# Patient Record
Sex: Male | Born: 1960 | Race: White | Hispanic: No | Marital: Married | Smoking: Never smoker
Health system: Southern US, Community
[De-identification: ages and names within clinical notes are randomized; demographics above are authoritative.]

## PROBLEM LIST (undated history)

## (undated) DIAGNOSIS — E7800 Pure hypercholesterolemia, unspecified: Secondary | ICD-10-CM

## (undated) DIAGNOSIS — G629 Polyneuropathy, unspecified: Secondary | ICD-10-CM

## (undated) DIAGNOSIS — Z8719 Personal history of other diseases of the digestive system: Secondary | ICD-10-CM

## (undated) DIAGNOSIS — N189 Chronic kidney disease, unspecified: Secondary | ICD-10-CM

## (undated) DIAGNOSIS — K219 Gastro-esophageal reflux disease without esophagitis: Secondary | ICD-10-CM

## (undated) DIAGNOSIS — G4730 Sleep apnea, unspecified: Secondary | ICD-10-CM

## (undated) DIAGNOSIS — E119 Type 2 diabetes mellitus without complications: Secondary | ICD-10-CM

## (undated) DIAGNOSIS — D649 Anemia, unspecified: Secondary | ICD-10-CM

## (undated) DIAGNOSIS — I10 Essential (primary) hypertension: Secondary | ICD-10-CM

## (undated) HISTORY — PX: EYE SURGERY: SHX253

## (undated) HISTORY — PX: TOE AMPUTATION: SHX809

## (undated) HISTORY — PX: CHOLECYSTECTOMY: SHX55

## (undated) HISTORY — PX: WISDOM TOOTH EXTRACTION: SHX21

---

## 1999-08-31 ENCOUNTER — Ambulatory Visit: Admission: RE | Admit: 1999-08-31 | Discharge: 1999-08-31 | Payer: Self-pay | Admitting: Internal Medicine

## 2001-06-28 ENCOUNTER — Ambulatory Visit (HOSPITAL_COMMUNITY): Admission: RE | Admit: 2001-06-28 | Discharge: 2001-06-28 | Payer: Self-pay | Admitting: Internal Medicine

## 2001-06-28 ENCOUNTER — Encounter: Payer: Self-pay | Admitting: Internal Medicine

## 2002-12-10 ENCOUNTER — Encounter (HOSPITAL_BASED_OUTPATIENT_CLINIC_OR_DEPARTMENT_OTHER): Admission: RE | Admit: 2002-12-10 | Discharge: 2003-03-10 | Payer: Self-pay | Admitting: Internal Medicine

## 2002-12-11 ENCOUNTER — Encounter (HOSPITAL_BASED_OUTPATIENT_CLINIC_OR_DEPARTMENT_OTHER): Payer: Self-pay | Admitting: Internal Medicine

## 2002-12-11 ENCOUNTER — Encounter: Admission: RE | Admit: 2002-12-11 | Discharge: 2002-12-11 | Payer: Self-pay | Admitting: Internal Medicine

## 2002-12-19 ENCOUNTER — Ambulatory Visit (HOSPITAL_COMMUNITY): Admission: RE | Admit: 2002-12-19 | Discharge: 2002-12-19 | Payer: Self-pay

## 2003-01-15 ENCOUNTER — Encounter: Admission: RE | Admit: 2003-01-15 | Discharge: 2003-01-15 | Payer: Self-pay

## 2003-01-24 ENCOUNTER — Ambulatory Visit (HOSPITAL_COMMUNITY): Admission: RE | Admit: 2003-01-24 | Discharge: 2003-01-24 | Payer: Self-pay

## 2012-08-19 ENCOUNTER — Encounter (HOSPITAL_COMMUNITY)
Admission: RE | Admit: 2012-08-19 | Discharge: 2012-08-19 | Disposition: A | Discharge status: Home / Self Care | Payer: Medicare Other | Source: Ambulatory Visit | Attending: Podiatry | Admitting: Podiatry

## 2012-08-19 ENCOUNTER — Encounter (HOSPITAL_COMMUNITY): Payer: Self-pay | Admitting: Pharmacy Technician

## 2012-08-19 ENCOUNTER — Other Ambulatory Visit (HOSPITAL_COMMUNITY): Payer: Self-pay | Admitting: Podiatry

## 2012-08-19 ENCOUNTER — Encounter (HOSPITAL_COMMUNITY): Payer: Self-pay

## 2012-08-19 ENCOUNTER — Ambulatory Visit (HOSPITAL_COMMUNITY)
Admission: RE | Admit: 2012-08-19 | Discharge: 2012-08-19 | Disposition: A | Discharge status: Home / Self Care | Payer: Medicare Other | Source: Ambulatory Visit | Attending: Podiatry | Admitting: Podiatry

## 2012-08-19 ENCOUNTER — Other Ambulatory Visit: Payer: Self-pay

## 2012-08-19 DIAGNOSIS — M869 Osteomyelitis, unspecified: Secondary | ICD-10-CM

## 2012-08-19 HISTORY — DX: Polyneuropathy, unspecified: G62.9

## 2012-08-19 HISTORY — DX: Type 2 diabetes mellitus without complications: E11.9

## 2012-08-19 HISTORY — DX: Gastro-esophageal reflux disease without esophagitis: K21.9

## 2012-08-19 HISTORY — DX: Pure hypercholesterolemia, unspecified: E78.00

## 2012-08-19 HISTORY — DX: Sleep apnea, unspecified: G47.30

## 2012-08-19 HISTORY — DX: Essential (primary) hypertension: I10

## 2012-08-19 LAB — BASIC METABOLIC PANEL
BUN: 22 mg/dL (ref 6–23)
CO2: 26 mEq/L (ref 19–32)
Calcium: 10.2 mg/dL (ref 8.4–10.5)
Chloride: 97 mEq/L (ref 96–112)
Creatinine, Ser: 1.41 mg/dL — ABNORMAL HIGH (ref 0.50–1.35)
GFR calc Af Amer: 64 mL/min — ABNORMAL LOW (ref >90)
GFR calc non Af Amer: 55 mL/min — ABNORMAL LOW (ref >90)
Glucose, Bld: 120 mg/dL — ABNORMAL HIGH (ref 70–99)
Potassium: 4.2 mEq/L (ref 3.5–5.1)
Sodium: 138 mEq/L (ref 135–145)

## 2012-08-19 LAB — SURGICAL PCR SCREEN
MRSA, PCR: POSITIVE — AB
Staphylococcus aureus: POSITIVE — AB

## 2012-08-19 LAB — HEMOGLOBIN AND HEMATOCRIT, BLOOD
HCT: 36.2 % — ABNORMAL LOW (ref 39.0–52.0)
Hemoglobin: 11.9 g/dL — ABNORMAL LOW (ref 13.0–17.0)

## 2012-08-19 NOTE — Patient Instructions (Addendum)
Nathan Oneal  08/19/2012   Your procedure is scheduled on:  08/20/2012  Report to [facility] at  1100  AM.  Call this number if you have problems the morning of surgery: [phone]   Remember:   Do not eat food or drink liquids after midnight.   Take these medicines the morning of surgery with A SIP OF WATER: lisinopril. Take 1/2 of Lantus dosage tonight. No diabetic meds tomorrow morning.   Do not wear jewelry, make-up or nail polish.  Do not wear lotions, powders, or perfumes.  Do not shave 48 hours prior to surgery. Men may shave face and neck.  Do not bring valuables to the hospital.  Contacts, dentures or bridgework may not be worn into surgery.  Leave suitcase in the car. After surgery it may be brought to your room.  For patients admitted to the hospital, checkout time is 11:00 AM the day of discharge.   Patients discharged the day of surgery will not be allowed to drive  home.  Name and phone number of your driver: family  Special Instructions: Shower using CHG 2 nights before surgery and the night before surgery.  If you shower the day of surgery use CHG.  Use special wash - you have one bottle of CHG for all showers.  You should use approximately 1/3 of the bottle for each shower.   Please read over the following fact sheets that you were given: Pain Booklet, Coughing and Deep Breathing, MRSA Information, Surgical Site Infection Prevention, Anesthesia Post-op Instructions and Care and Recovery After Surgery Amputation Many new amputations occur each year. The most common causes of amputation of the lower extremity (the hip down) are:  Disease.  Injury caused in an accidents or wars (trauma).  Birth defects.  Lumps (tumors) that are cancer. Upper extremity amputation is usually the result of trauma or birth defect, with disease being a less common cause. COMMON PROBLEMS After an amputation a number of issues need to be considered. Getting around and self-care  are early problems that must be dealt with. A complete rehabilitation program will help the amputee recover mobility. A team approach of caregivers helps the most. Caregivers that can provide a well rounded program include:   Physicians.  Therapists.  Nurses.  Social workers.  Psychologists. Usually there are problems with body image and coping with lifestyle changes. A grieving period similar to dealing with a death in the family is common after an amputation. Talking to a trained professional with experience in treating people with similar problems can be very helpful. When returning to a previous lifestyle, questions about sexuality can arise. Many of these uncertainties are normal. These can be discussed with your psychologist or rehabilitation specialist. REHABILITATION AND RETURN TO WORK AND ACTIVITIES Returning to recreational activities and employment are part of recovery. Many times, changes to recreation equipment can allow return to a sport or hobby. A device that substitutes the missing part of the body is called a prosthetic. Many prosthetic manufacturers produce components designed for sports. Be sure to discuss all of your leisure interests with your prosthetist. This is the person who helps provide you with custom made replacement limbs. Your physician will also help to select a prosthetic that will meet your needs. Employers will vary in their willingness to change a work environment in order to help people with disabilities. Your therapists can perform job site evaluations. Your therapist can then make recommendations to help  with your work area. Some amputees will not be able to return to previous jobs. Your local Office of Vocational Rehabilitation can assist you in job retraining.  Once you are past the initial rehabilitation stage you will have ongoing contact with caregivers and a prosthetist. You need to work closely with them in making decisions about your prosthetic  device. PROGNOSIS  Amputation should not end your joy of life. There are people with limb loss in nearly all walks of life. They are in a wide variety of professions. They participate in nearly all sports. Ask your caregivers about support groups and sports organizations in your area. They can help you with referral to organizations that will be helpful to you. Document Released: 12/10/2001 Document Revised: 06/12/2011 Document Reviewed: 02/04/2007 [facility] Patient Information 2013 [place], [place]. PATIENT INSTRUCTIONS POST-ANESTHESIA  IMMEDIATELY FOLLOWING SURGERY:  Do not drive or operate machinery for the first twenty four hours after surgery.  Do not make any important decisions for twenty four hours after surgery or while taking narcotic pain medications or sedatives.  If you develop intractable nausea and vomiting or a severe headache please notify your doctor immediately.  FOLLOW-UP:  Please make an appointment with your surgeon as instructed. You do not need to follow up with anesthesia unless specifically instructed to do so.  WOUND CARE INSTRUCTIONS (if applicable):  Keep a dry clean dressing on the anesthesia/puncture wound site if there is drainage.  Once the wound has quit draining you may leave it open to air.  Generally you should leave the bandage intact for twenty four hours unless there is drainage.  If the epidural site drains for more than 36-48 hours please call the anesthesia department.  QUESTIONS?:  Please feel free to call your physician or the hospital operator if you have any questions, and they will be happy to assist you.

## 2012-08-20 ENCOUNTER — Ambulatory Visit (HOSPITAL_COMMUNITY): Payer: Medicare Other | Admitting: Anesthesiology

## 2012-08-20 ENCOUNTER — Ambulatory Visit (HOSPITAL_COMMUNITY)
Admission: RE | Admit: 2012-08-20 | Discharge: 2012-08-20 | Disposition: A | Discharge status: Home / Self Care | Payer: Medicare Other | Source: Ambulatory Visit | Attending: Podiatry | Admitting: Podiatry

## 2012-08-20 ENCOUNTER — Ambulatory Visit (HOSPITAL_COMMUNITY): Payer: Medicare Other

## 2012-08-20 ENCOUNTER — Encounter (HOSPITAL_COMMUNITY)
Admission: RE | Disposition: A | Discharge status: Home / Self Care | Payer: Self-pay | Source: Ambulatory Visit | Attending: Podiatry

## 2012-08-20 ENCOUNTER — Encounter (HOSPITAL_COMMUNITY): Payer: Self-pay | Admitting: Anesthesiology

## 2012-08-20 DIAGNOSIS — I10 Essential (primary) hypertension: Secondary | ICD-10-CM | POA: Insufficient documentation

## 2012-08-20 DIAGNOSIS — E119 Type 2 diabetes mellitus without complications: Secondary | ICD-10-CM | POA: Insufficient documentation

## 2012-08-20 DIAGNOSIS — Z01812 Encounter for preprocedural laboratory examination: Secondary | ICD-10-CM | POA: Insufficient documentation

## 2012-08-20 DIAGNOSIS — M869 Osteomyelitis, unspecified: Secondary | ICD-10-CM | POA: Insufficient documentation

## 2012-08-20 HISTORY — PX: AMPUTATION: SHX166

## 2012-08-20 SURGERY — AMPUTATION DIGIT
Anesthesia: Monitor Anesthesia Care | Site: Foot | Laterality: Left | Wound class: Dirty or Infected

## 2012-08-20 MED ORDER — FENTANYL CITRATE 0.05 MG/ML IJ SOLN
INTRAMUSCULAR | Status: AC
  Filled 2012-08-20: qty 2

## 2012-08-20 MED ORDER — VANCOMYCIN HCL 10 G IV SOLR
1500.0000 mg | Freq: Once | INTRAVENOUS | Status: DC
  Filled 2012-08-20: qty 1500

## 2012-08-20 MED ORDER — SODIUM CHLORIDE 0.9 % IV SOLN
1500.0000 mg | Freq: Once | INTRAVENOUS | Status: AC
  Administered 2012-08-20: 1500 mg via INTRAVENOUS

## 2012-08-20 MED ORDER — VANCOMYCIN HCL IN DEXTROSE 1-5 GM/200ML-% IV SOLN: 1000.0000 mg | Freq: Once | INTRAVENOUS | Status: DC

## 2012-08-20 MED ORDER — FENTANYL CITRATE 0.05 MG/ML IJ SOLN
INTRAMUSCULAR | Status: DC | PRN
  Administered 2012-08-20 (×4): 25 ug via INTRAVENOUS

## 2012-08-20 MED ORDER — LACTATED RINGERS IV SOLN
INTRAVENOUS | Status: DC
  Administered 2012-08-20: 13:00:00 via INTRAVENOUS

## 2012-08-20 MED ORDER — PROPOFOL 10 MG/ML IV EMUL
INTRAVENOUS | Status: AC
  Filled 2012-08-20: qty 20

## 2012-08-20 MED ORDER — MIDAZOLAM HCL 2 MG/2ML IJ SOLN
INTRAMUSCULAR | Status: AC
  Filled 2012-08-20: qty 2

## 2012-08-20 MED ORDER — VANCOMYCIN HCL IN DEXTROSE 1-5 GM/200ML-% IV SOLN
INTRAVENOUS | Status: AC
  Filled 2012-08-20: qty 200

## 2012-08-20 MED ORDER — DEXTROSE 50 % IV SOLN
25.0000 mL | Freq: Once | INTRAVENOUS | Status: AC
  Administered 2012-08-20: 25 mL via INTRAVENOUS

## 2012-08-20 MED ORDER — BUPIVACAINE HCL (PF) 0.5 % IJ SOLN
INTRAMUSCULAR | Status: AC
  Filled 2012-08-20: qty 30

## 2012-08-20 MED ORDER — DEXTROSE 50 % IV SOLN
INTRAVENOUS | Status: AC
  Filled 2012-08-20: qty 50

## 2012-08-20 MED ORDER — MUPIROCIN 2 % EX OINT
TOPICAL_OINTMENT | CUTANEOUS | Status: AC
  Filled 2012-08-20: qty 22

## 2012-08-20 MED ORDER — FENTANYL CITRATE 0.05 MG/ML IJ SOLN: 25.0000 ug | INTRAMUSCULAR | Status: DC | PRN

## 2012-08-20 MED ORDER — MIDAZOLAM HCL 2 MG/2ML IJ SOLN
1.0000 mg | INTRAMUSCULAR | Status: DC | PRN
  Administered 2012-08-20: 2 mg via INTRAVENOUS

## 2012-08-20 MED ORDER — 0.9 % SODIUM CHLORIDE (POUR BTL) OPTIME
TOPICAL | Status: DC | PRN
  Administered 2012-08-20: 1000 mL

## 2012-08-20 MED ORDER — PROPOFOL INFUSION 10 MG/ML OPTIME
INTRAVENOUS | Status: DC | PRN
  Administered 2012-08-20: 14:00:00 via INTRAVENOUS
  Administered 2012-08-20: 125 ug/kg/min via INTRAVENOUS

## 2012-08-20 MED ORDER — BUPIVACAINE HCL (PF) 0.5 % IJ SOLN
INTRAMUSCULAR | Status: DC | PRN
  Administered 2012-08-20: 20 mL

## 2012-08-20 MED ORDER — LIDOCAINE HCL (PF) 1 % IJ SOLN
INTRAMUSCULAR | Status: AC
  Filled 2012-08-20: qty 5

## 2012-08-20 SURGICAL SUPPLY — 42 items
APL SKNCLS STERI-STRIP NONHPOA (GAUZE/BANDAGES/DRESSINGS) ×1
BAG HAMPER (MISCELLANEOUS) ×2 IMPLANT
BANDAGE CONFORM 2  STR LF (GAUZE/BANDAGES/DRESSINGS) ×2 IMPLANT
BANDAGE ELASTIC 4 VELCRO NS (GAUZE/BANDAGES/DRESSINGS) ×2 IMPLANT
BANDAGE ESMARK 4X12 BL STRL LF (DISPOSABLE) ×1 IMPLANT
BANDAGE GAUZE ELAST BULKY 4 IN (GAUZE/BANDAGES/DRESSINGS) ×2 IMPLANT
BENZOIN TINCTURE PRP APPL 2/3 (GAUZE/BANDAGES/DRESSINGS) ×1 IMPLANT
BLADE SURG 15 STRL LF DISP TIS (BLADE) ×2 IMPLANT
BLADE SURG 15 STRL SS (BLADE) ×4
BNDG CMPR 12X4 ELC STRL LF (DISPOSABLE) ×1
BNDG ESMARK 4X12 BLUE STRL LF (DISPOSABLE) ×2
CLOTH BEACON ORANGE TIMEOUT ST (SAFETY) ×2 IMPLANT
COVER LIGHT HANDLE STERIS (MISCELLANEOUS) ×4 IMPLANT
CUFF TOURNIQUET SINGLE 18IN (TOURNIQUET CUFF) ×2 IMPLANT
DECANTER SPIKE VIAL GLASS SM (MISCELLANEOUS) ×2 IMPLANT
DRSG ADAPTIC 3X8 NADH LF (GAUZE/BANDAGES/DRESSINGS) ×2 IMPLANT
ELECT REM PT RETURN 9FT ADLT (ELECTROSURGICAL) ×2
ELECTRODE REM PT RTRN 9FT ADLT (ELECTROSURGICAL) ×1 IMPLANT
GLOVE BIO SURGEON STRL SZ7.5 (GLOVE) ×2 IMPLANT
GLOVE BIOGEL PI IND STRL 7.0 (GLOVE) IMPLANT
GLOVE BIOGEL PI INDICATOR 7.0 (GLOVE) ×2
GLOVE ECLIPSE 6.5 STRL STRAW (GLOVE) ×1 IMPLANT
GLOVE EXAM NITRILE LRG STRL (GLOVE) ×1 IMPLANT
GOWN STRL REIN XL XLG (GOWN DISPOSABLE) ×5 IMPLANT
KIT ROOM TURNOVER APOR (KITS) ×2 IMPLANT
MANIFOLD NEPTUNE II (INSTRUMENTS) ×2 IMPLANT
NDL HYPO 27GX1-1/4 (NEEDLE) ×2 IMPLANT
NEEDLE HYPO 27GX1-1/4 (NEEDLE) ×4 IMPLANT
NS IRRIG 1000ML POUR BTL (IV SOLUTION) ×2 IMPLANT
PACK BASIC LIMB (CUSTOM PROCEDURE TRAY) ×2 IMPLANT
PAD ARMBOARD 7.5X6 YLW CONV (MISCELLANEOUS) ×2 IMPLANT
SET BASIN LINEN APH (SET/KITS/TRAYS/PACK) ×2 IMPLANT
SOL PREP PROV IODINE SCRUB 4OZ (MISCELLANEOUS) ×2 IMPLANT
SPONGE GAUZE 4X4 12PLY (GAUZE/BANDAGES/DRESSINGS) ×2 IMPLANT
SPONGE LAP 18X18 X RAY DECT (DISPOSABLE) ×2 IMPLANT
STRIP CLOSURE SKIN 1/2X4 (GAUZE/BANDAGES/DRESSINGS) ×1 IMPLANT
SUT ETHILON 4 0 PS 2 18 (SUTURE) IMPLANT
SUT PROLENE 4 0 PS 2 18 (SUTURE) ×3 IMPLANT
SUT VIC AB 4-0 PS2 27 (SUTURE) ×1 IMPLANT
SWAB CULTURE LIQ STUART DBL (MISCELLANEOUS) ×1 IMPLANT
SYR CONTROL 10ML LL (SYRINGE) ×4 IMPLANT
TUBE ANAEROBIC PORT A CUL  W/M (MISCELLANEOUS) ×1 IMPLANT

## 2012-08-20 NOTE — Anesthesia Procedure Notes (Signed)
Procedure Name: MAC Date/Time: 08/20/2012 1:05 PM Performed by: Franco Nones Pre-anesthesia Checklist: Patient identified, Emergency Drugs available, Suction available, Timeout performed and Patient being monitored Patient Re-evaluated:Patient Re-evaluated prior to inductionOxygen Delivery Method: Nasal Cannula    Date/Time: 08/20/2012 1:14 PM Performed by: Franco Nones Oxygen Delivery Method: Non-rebreather mask

## 2012-08-20 NOTE — Anesthesia Preprocedure Evaluation (Signed)
Anesthesia Evaluation  Patient identified by MRN, date of birth, ID band Patient awake    Reviewed: Allergy & Precautions, H&P , NPO status , Patient's Chart, lab work & pertinent test results  Airway Mallampati: II      Dental  (+) Teeth Intact   Pulmonary sleep apnea ,  breath sounds clear to auscultation        Cardiovascular hypertension, Pt. on medications Rhythm:Regular Rate:Normal     Neuro/Psych    GI/Hepatic GERD-  Medicated and Controlled,  Endo/Other  diabetes, Well Controlled, Type 2, Oral Hypoglycemic Agents  Renal/GU      Musculoskeletal   Abdominal   Peds  Hematology   Anesthesia Other Findings   Reproductive/Obstetrics                           Anesthesia Physical Anesthesia Plan  ASA: III  Anesthesia Plan: MAC   Post-op Pain Management:    Induction: Intravenous  Airway Management Planned: Nasal Cannula  Additional Equipment:   Intra-op Plan:   Post-operative Plan:   Informed Consent: I have reviewed the patients History and Physical, chart, labs and discussed the procedure including the risks, benefits and alternatives for the proposed anesthesia with the patient or authorized representative who has indicated his/her understanding and acceptance.     Plan Discussed with:   Anesthesia Plan Comments:         Anesthesia Quick Evaluation

## 2012-08-20 NOTE — Op Note (Signed)
OPERATIVE NOTE  DATE OF PROCEDURE:  08/20/2012  SURGEON:   Dallas Schimke, DPM  OR STAFF:   Circulator: Larwance Rote Protzek, RN Relief Scrub: Donald Pore, CST Scrub Person: Jari Sportsman, CST   PREOPERATIVE DIAGNOSIS:   Osteomyelitis left foot  POSTOPERATIVE DIAGNOSIS: Same  PROCEDURE: 1.  Amputation of the third digit, left foot 2.  Amputation of the fourth digit, left foot  ANESTHESIA:  Monitor Anesthesia Care   HEMOSTASIS:   Pneumatic ankle tourniquet set at 250 mmHg  ESTIMATED BLOOD LOSS:   Minimal (<5 cc)  MATERIALS USED:  None  INJECTABLES: 0.5% Marcaine plain  PATHOLOGY:   1.  Third digit, left foot 2.  Fourth digit, left foot  COMPLICATIONS:   None  INDICATIONS:  Non-healing wound of the left third digit and left fourth digit.  Erosive changes are present on plain films consistent with osteomyelitis.  DESCRIPTION OF THE PROCEDURE:   The patient was brought to the operating room and placed on the operative table in the supine position.  A pneumatic ankle tourniquet was applied to the patient's ankle.  Following sedation, the surgical site was anesthetized with 0.5% Marcaine plain.  The foot was then prepped, scrubbed, and draped in the usual sterile technique.  The foot was elevated, exsanguinated and the pneumatic ankle tourniquet inflated to 250 mmHg.    Attention was directed to the residual portion of the third digit and fourth digits.  Two converging elliptical incisions were made encompassing the third and fourth digits.  The third and fourth metarsophalangeal joints were disarticulated.  The digits were passed from the operative field.  The surgical site was irrigated with copious amounts of sterile irrigant.  The subcutaneous tissue was reapproximated with 4-0 Vicryl.  The skin was reapproximated with 4-0 Prolene.  The incision was reinforced with Steri-Strips.  A sterile compressive dressing was applied to the operative foot.   The patient's ankle tourniquet was deflated.  A prompt hyperemic response was noted to all digits of the operative foot.    The patient tolerated the procedure well.  The patient was then transferred to PACU with vital signs stable.  Following a period of postoperative monitoring, the patient will be discharged home.

## 2012-08-20 NOTE — Anesthesia Postprocedure Evaluation (Signed)
  Anesthesia Post-op Note  Patient: Nathan Oneal  Procedure(s) Performed: Procedure(s): AMPUTATION THIRD AND FOURTH TOE LEFT FOOT (Left)  Patient Location: Short Stay  Anesthesia Type:MAC  Level of Consciousness: awake, oriented and patient cooperative  Airway and Oxygen Therapy: Patient Spontanous Breathing  Post-op Pain: none  Post-op Assessment: Post-op Vital signs reviewed, Patient's Cardiovascular Status Stable, Respiratory Function Stable and No signs of Nausea or vomiting  Post-op Vital Signs: Reviewed and stable  Complications: No apparent anesthesia complications

## 2012-08-20 NOTE — Transfer of Care (Signed)
Immediate Anesthesia Transfer of Care Note  Patient: Nathan Oneal  Procedure(s) Performed: Procedure(s) (LRB): AMPUTATION THIRD AND FOURTH TOE LEFT FOOT (Left)  Patient Location: Shortstay  Anesthesia Type: MAC  Level of Consciousness: awake  Airway & Oxygen Therapy: Patient Spontanous Breathing   Post-op Assessment: Report given to PACU RN, Post -op Vital signs reviewed and stable and Patient moving all extremities  Post vital signs: Reviewed and stable  Complications: No apparent anesthesia complications

## 2012-08-20 NOTE — H&P (Signed)
HISTORY AND PHYSICAL INTERVAL NOTE:  08/20/2012  12:10 PM  Nathan Oneal  has presented today for surgery, with the diagnosis of osteomyelitis left foot.  The various methods of treatment have been discussed with the patient.  No guarantees were given.  After consideration of risks, benefits and other options for treatment, the patient has consented to surgery.  I have reviewed the patients' chart and labs.    Patient Vitals for the past 24 hrs:  Resp SpO2  08/20/12 1205 10 97 %  08/20/12 1200 15 97 %  08/20/12 1155 14 95 %  08/20/12 1150 14 98 %  08/20/12 1145 13 97 %  08/20/12 1140 23 98 %    A history and physical examination was performed in my office.  The patient was reexamined.  There have been no changes to this history and physical examination.  Dallas Schimke, DPM

## 2012-08-21 LAB — GLUCOSE, CAPILLARY
Glucose-Capillary: 66 mg/dL — ABNORMAL LOW (ref 70–99)
Glucose-Capillary: 69 mg/dL — ABNORMAL LOW (ref 70–99)

## 2012-08-22 ENCOUNTER — Encounter (HOSPITAL_COMMUNITY): Payer: Self-pay | Admitting: Podiatry

## 2012-08-24 LAB — WOUND CULTURE

## 2012-08-25 LAB — ANAEROBIC CULTURE

## 2014-02-20 ENCOUNTER — Encounter (HOSPITAL_COMMUNITY): Payer: Self-pay | Admitting: *Deleted

## 2014-02-20 ENCOUNTER — Emergency Department (HOSPITAL_COMMUNITY)
Admission: EM | Admit: 2014-02-20 | Discharge: 2014-02-20 | Disposition: A | Discharge status: Home / Self Care | Payer: Medicare Other | Attending: Emergency Medicine | Admitting: Emergency Medicine

## 2014-02-20 DIAGNOSIS — E119 Type 2 diabetes mellitus without complications: Secondary | ICD-10-CM | POA: Insufficient documentation

## 2014-02-20 DIAGNOSIS — Z794 Long term (current) use of insulin: Secondary | ICD-10-CM | POA: Insufficient documentation

## 2014-02-20 DIAGNOSIS — R42 Dizziness and giddiness: Secondary | ICD-10-CM | POA: Diagnosis present

## 2014-02-20 DIAGNOSIS — E780 Pure hypercholesterolemia: Secondary | ICD-10-CM | POA: Insufficient documentation

## 2014-02-20 DIAGNOSIS — I10 Essential (primary) hypertension: Secondary | ICD-10-CM | POA: Diagnosis not present

## 2014-02-20 DIAGNOSIS — E1165 Type 2 diabetes mellitus with hyperglycemia: Secondary | ICD-10-CM | POA: Insufficient documentation

## 2014-02-20 DIAGNOSIS — Z79899 Other long term (current) drug therapy: Secondary | ICD-10-CM | POA: Diagnosis not present

## 2014-02-20 DIAGNOSIS — K219 Gastro-esophageal reflux disease without esophagitis: Secondary | ICD-10-CM | POA: Insufficient documentation

## 2014-02-20 DIAGNOSIS — R739 Hyperglycemia, unspecified: Secondary | ICD-10-CM

## 2014-02-20 DIAGNOSIS — R51 Headache: Secondary | ICD-10-CM | POA: Insufficient documentation

## 2014-02-20 LAB — CBC WITH DIFFERENTIAL/PLATELET
BASOS ABS: 0 10*3/uL (ref 0.0–0.1)
Basophils Relative: 1 % (ref 0–1)
Eosinophils Absolute: 0.1 10*3/uL (ref 0.0–0.7)
Eosinophils Relative: 2 % (ref 0–5)
HEMATOCRIT: 35.5 % — AB (ref 39.0–52.0)
Hemoglobin: 11.7 g/dL — ABNORMAL LOW (ref 13.0–17.0)
LYMPHS PCT: 29 % (ref 12–46)
Lymphs Abs: 1.9 10*3/uL (ref 0.7–4.0)
MCH: 30 pg (ref 26.0–34.0)
MCHC: 33 g/dL (ref 30.0–36.0)
MCV: 91 fL (ref 78.0–100.0)
MONO ABS: 0.5 10*3/uL (ref 0.1–1.0)
Monocytes Relative: 8 % (ref 3–12)
NEUTROS ABS: 3.9 10*3/uL (ref 1.7–7.7)
Neutrophils Relative %: 60 % (ref 43–77)
Platelets: 270 10*3/uL (ref 150–400)
RBC: 3.9 MIL/uL — ABNORMAL LOW (ref 4.22–5.81)
RDW: 14.3 % (ref 11.5–15.5)
WBC: 6.4 10*3/uL (ref 4.0–10.5)

## 2014-02-20 LAB — BASIC METABOLIC PANEL
Anion gap: 11 (ref 5–15)
BUN: 17 mg/dL (ref 6–23)
CO2: 27 meq/L (ref 19–32)
Calcium: 9.6 mg/dL (ref 8.4–10.5)
Chloride: 97 mEq/L (ref 96–112)
Creatinine, Ser: 1.51 mg/dL — ABNORMAL HIGH (ref 0.50–1.35)
GFR calc Af Amer: 58 mL/min — ABNORMAL LOW (ref >90)
GFR, EST NON AFRICAN AMERICAN: 50 mL/min — AB (ref >90)
GLUCOSE: 426 mg/dL — AB (ref 70–99)
POTASSIUM: 4.5 meq/L (ref 3.7–5.3)
SODIUM: 135 meq/L — AB (ref 137–147)

## 2014-02-20 LAB — TROPONIN I: Troponin I: <0.3 ng/mL (ref <0.30)

## 2014-02-20 MED ORDER — METFORMIN HCL 500 MG PO TABS
1000.0000 mg | ORAL_TABLET | Freq: Once | ORAL | Status: AC
Start: 2014-02-20 — End: 2014-02-20
  Administered 2014-02-20: 1000 mg via ORAL
  Filled 2014-02-20: qty 2

## 2014-02-20 NOTE — Discharge Instructions (Signed)
Glucose was elevated.   Need to be back on your medication

## 2014-02-20 NOTE — ED Provider Notes (Signed)
CSN: [id number]     Arrival date & time 02/20/14  2049 History  This chart was scribe for Donnetta HutchingBrian Denelle Capurro, MD by Angelene GiovanniEmmanuella Mensah, ED Scribe. The patient was seen in room [facility] and the patient's care was started at 9:13 PM.    Chief Complaint  Patient presents with  . Dizziness   The history is provided by the patient. No language interpreter was used.   HPI Comments: Nathan Oneal is a 53 y.o. male who was brought in law enforcement presents to the Emergency Department complaining of dizziness. He reports associated frontal headache that lasted for 10-15 minutes which has since resolved onset tonight after eating dinner. He reports that his shoulder was heavy. He denies CP, dyspnea, . He explains that his BP normally ranges around 130. He reports that his blood sugar is normally around 120-200. He has not been taking his medication lately  Past Medical History  Diagnosis Date  . Hypertension   . Diabetes mellitus without complication   . Hypercholesteremia   . Sleep apnea     cpap  . GERD (gastroesophageal reflux disease)   . Neuropathy    Past Surgical History  Procedure Laterality Date  . Cholecystectomy    . Toe amputation      left foot-5th and 2nd toe and tip of big toe. right tip of 4th toe removed  . Eye surgery      laser of eyes for retinopathy  . Amputation Left 08/20/2012    Procedure: AMPUTATION THIRD AND FOURTH TOE LEFT FOOT;  Surgeon: Dallas SchimkeBenjamin Ivan McKinney, DPM;  Location: [facility];  Service: Orthopedics;  Laterality: Left;   History reviewed. No pertinent family history. History  Substance Use Topics  . Smoking status: Never Smoker   . Smokeless tobacco: Not on file  . Alcohol Use: No    Review of Systems  Constitutional: Negative for fever.  Respiratory: Negative for shortness of breath.   Cardiovascular: Negative for chest pain.  Neurological: Positive for dizziness and headaches.      Allergies  Review of patient's allergies indicates no known  allergies.  Home Medications   Prior to Admission medications   Medication Sig Start Date End Date Taking? Authorizing Provider  atorvastatin (LIPITOR) 20 MG tablet Take 20 mg by mouth daily.   Yes Historical Provider, MD  gemfibrozil (LOPID) 600 MG tablet Take 600 mg by mouth 2 (two) times daily.    Yes Historical Provider, MD  insulin glargine (LANTUS) 100 UNIT/ML injection Inject 30 Units into the skin at bedtime as needed (When sugar is running high.).   Yes Historical Provider, MD  lisinopril (PRINIVIL,ZESTRIL) 10 MG tablet Take 10 mg by mouth daily.   Yes Historical Provider, MD  metFORMIN (GLUCOPHAGE) 1000 MG tablet Take 1,000 mg by mouth 2 (two) times daily with a meal.   Yes Historical Provider, MD  omeprazole (PRILOSEC) 40 MG capsule Take 40 mg by mouth daily.   Yes Historical Provider, MD  pioglitazone (ACTOS) 45 MG tablet Take 45 mg by mouth daily.   Yes Historical Provider, MD  glyBURIDE-metformin (GLUCOVANCE) 5-500 MG per tablet Take 1 tablet by mouth 2 (two) times daily.    Historical Provider, MD  levofloxacin (LEVAQUIN) 500 MG tablet Take 500 mg by mouth daily.    Historical Provider, MD  lisinopril-hydrochlorothiazide (PRINZIDE,ZESTORETIC) 20-12.5 MG per tablet Take 1 tablet by mouth 2 (two) times daily.    Historical Provider, MD  loratadine (CLARITIN) 10 MG tablet Take 10 mg by mouth  daily.    Historical Provider, MD   BP 153/79 mmHg  Pulse 81  Temp(Src) 98.6 F (37 C) (Oral)  Resp 16  Ht 6' (1.829 m)  Wt 285 lb (129.275 kg)  BMI 38.64 kg/m2  SpO2 96% Physical Exam  Constitutional: He is oriented to person, place, and time. He appears well-developed and well-nourished.  Obese  HENT:  Head: Normocephalic and atraumatic.  Eyes: Conjunctivae and EOM are normal. Pupils are equal, round, and reactive to light.  Neck: Normal range of motion. Neck supple.  Cardiovascular: Normal rate, regular rhythm and normal heart sounds.   Pulmonary/Chest: Effort normal and breath  sounds normal.  Abdominal: Soft. Bowel sounds are normal.  Musculoskeletal: Normal range of motion.  Neurological: He is alert and oriented to person, place, and time.  Skin: Skin is warm and dry.  Psychiatric: He has a normal mood and affect. His behavior is normal.  Nursing note and vitals reviewed.   ED Course  Procedures (including critical care time) DIAGNOSTIC STUDIES: Oxygen Saturation is 100% on RA, normal by my interpretation.    COORDINATION OF CARE: 9:16 PM- Pt advised of plan for treatment and pt agrees.    Labs Review Labs Reviewed  BASIC METABOLIC PANEL - Abnormal; Notable for the following:    Sodium 135 (*)    Glucose, Bld 426 (*)    Creatinine, Ser 1.51 (*)    GFR calc non Af Amer 50 (*)    GFR calc Af Amer 58 (*)    All other components within normal limits  CBC WITH DIFFERENTIAL - Abnormal; Notable for the following:    RBC 3.90 (*)    Hemoglobin 11.7 (*)    HCT 35.5 (*)    All other components within normal limits  TROPONIN I    Imaging Review No results found.   EKG Interpretation None      Date: 02/20/2014  Rate: 87  Rhythm: normal sinus rhythm  QRS Axis: normal  Intervals: normal  ST/T Wave abnormalities: normal  Conduction Disutrbances: none  Narrative Interpretation: unremarkable    MDM   Final diagnoses:  Hyperglycemia   Patient is symptom free at this time. No chest pain or dyspnea. He is back to normal. He has not been taking his medications last couple days. Rx metformin 1000 mg in ED.  I personally performed the services described in this documentation, which was scribed in my presence. The recorded information has been reviewed and is accurate.     Donnetta HutchingBrian Jaylie Neaves, MD 02/20/14 2257

## 2014-02-20 NOTE — ED Notes (Signed)
States he had sudden onset "shoulder pressure" and frontal HA.  Denies chest pain, SOB, abdominal pain, vertigo. No nystagmus noted.  Denies HA, dizziness at present.

## 2014-02-20 NOTE — ED Notes (Signed)
Pt is prisoner here with law enforcement,  In cuffs.  Alert, ambulatory in to triage.  Felt dizzy earlier and had bp checked and was low.  Had not had his regular med.since Wednesday.  Had pressure in shoulders.

## 2014-02-20 NOTE — ED Notes (Signed)
Patient with no complaints at this time. Respirations even and unlabored. Skin warm/dry. Discharge instructions reviewed with patient at this time. Patient given opportunity to voice concerns/ask questions. Patient discharged at this time and left Emergency Department with steady gait.   

## 2014-05-01 IMAGING — CR DG FOOT COMPLETE 3+V*L*
3 series · 3 of 3 positions shown · non-contrast
Comparison: None.

CLINICAL DATA: Diabetes, possible osteomyelitis

LEFT FOOT - COMPLETE 3+ VIEW

[view not recorded (1 of 3)]
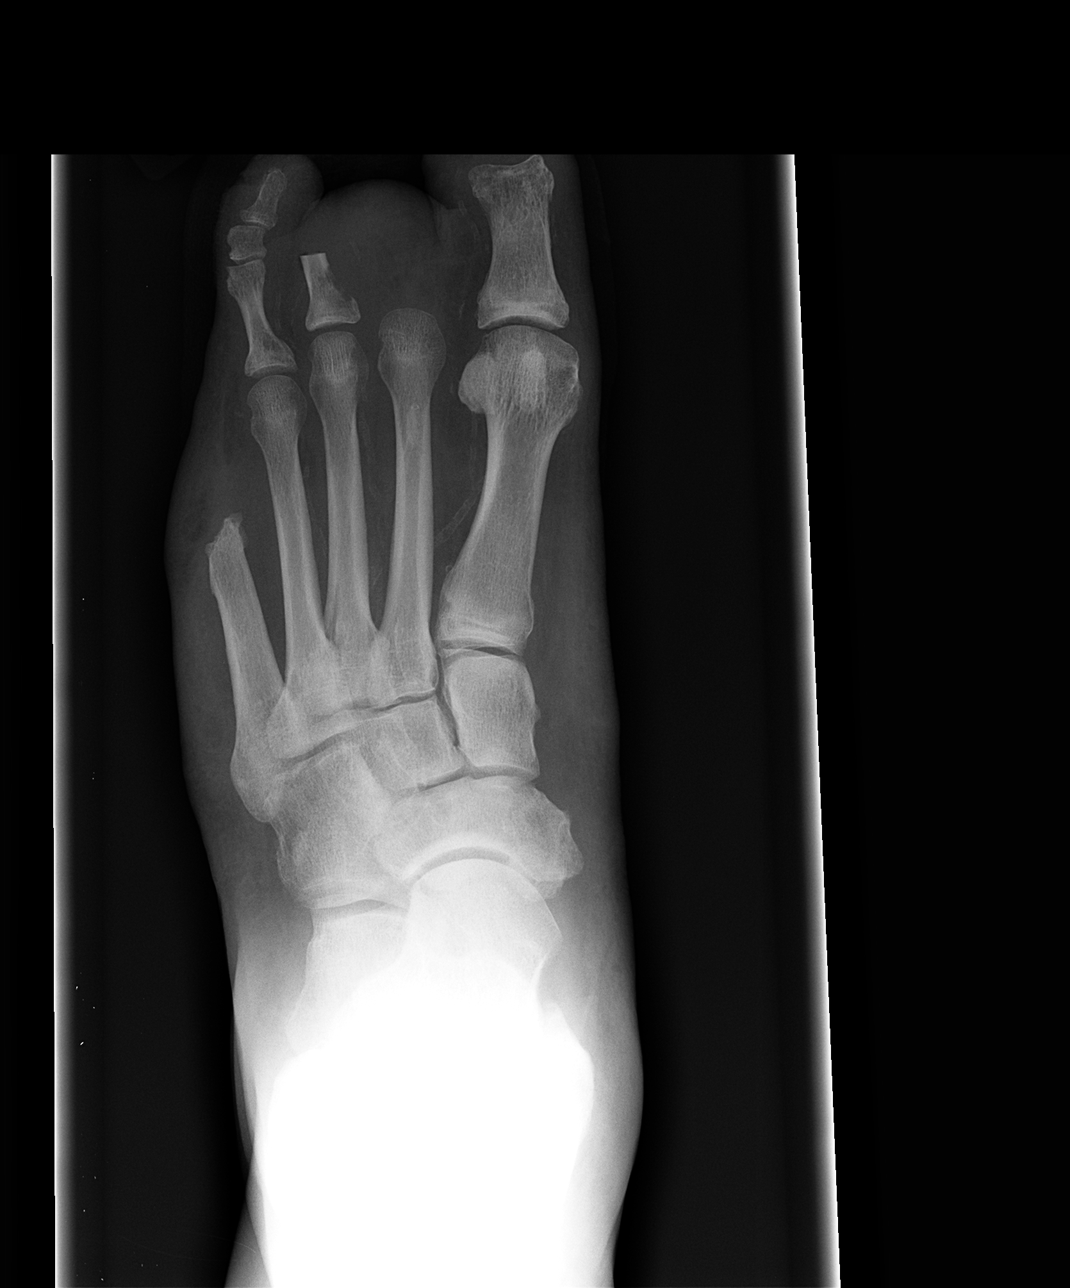

[view not recorded (2 of 3)]
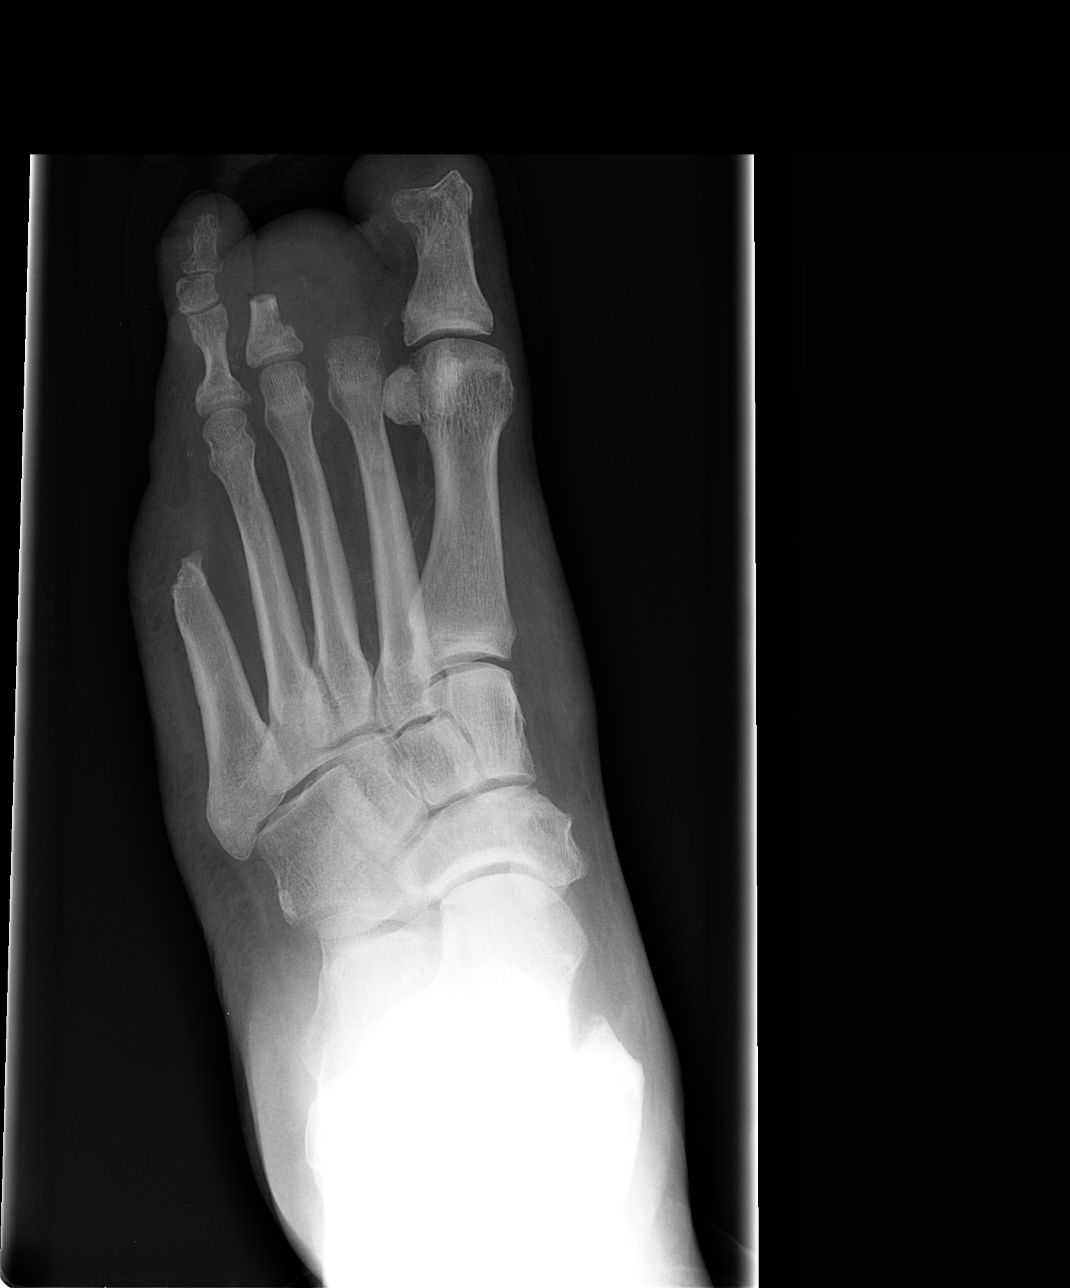

[view not recorded (3 of 3)]
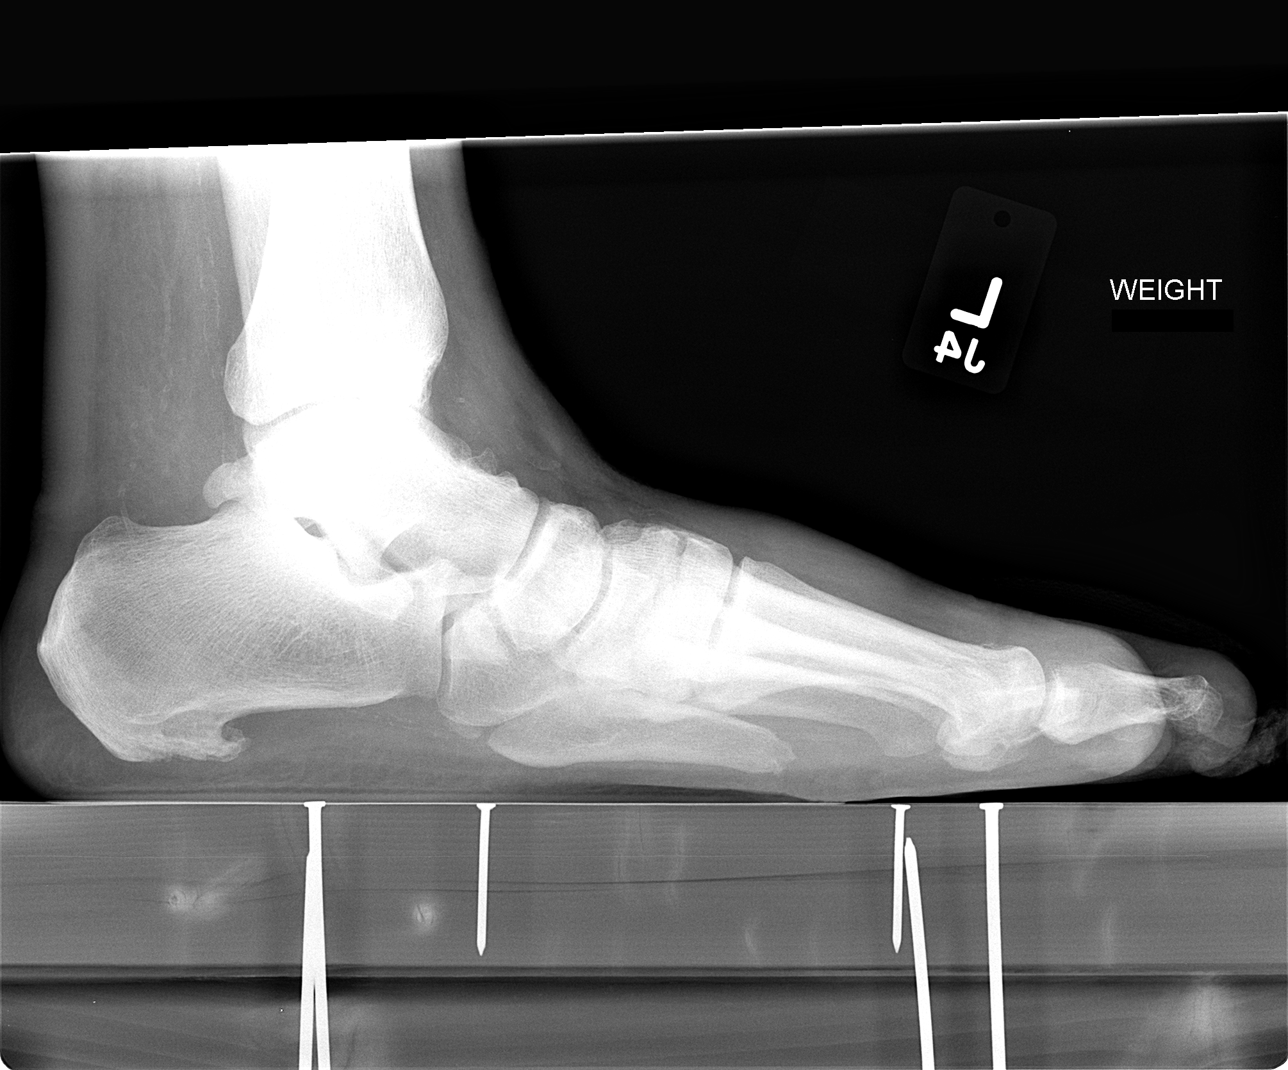

[3 of 3 positions shown; findings below may reference images not displayed]

FINDINGS: There been prior amputations of the distal phalanx of the
left great toe, the left second toe, the distal and mid phalanges
of the left third toe, and the left fifth toe and distal left fifth
metatarsal.  Currently, there is erosion of the remaining fragment
of the proximal phalanx of the left third toe as well as the
lateral aspect of the distal portion of the Yoel Tiger middle phalanx
of the left great toe, consistent with osteomyelitis.  Soft tissue
swelling is present primarily over the head of the left second
metatarsal and along the partially amputated left fifth metatarsal.
A large calcaneal plantar degenerative spur is noted.
IMPRESSION: Findings consistent with osteomyelitis involving the remaining
portion of the proximal phalanx of the left third toe and the
lateral aspect of the proximal phalanx of the left great toe

## 2015-05-05 DEATH — deceased

## 2017-11-02 ENCOUNTER — Other Ambulatory Visit: Payer: Self-pay

## 2017-11-02 DIAGNOSIS — N186 End stage renal disease: Secondary | ICD-10-CM

## 2017-11-02 DIAGNOSIS — Z992 Dependence on renal dialysis: Principal | ICD-10-CM

## 2017-11-02 DIAGNOSIS — Z01812 Encounter for preprocedural laboratory examination: Secondary | ICD-10-CM

## 2017-11-19 ENCOUNTER — Ambulatory Visit (HOSPITAL_COMMUNITY)
Admission: RE | Admit: 2017-11-19 | Discharge: 2017-11-19 | Disposition: A | Discharge status: Home / Self Care | Payer: Medicare HMO | Source: Ambulatory Visit | Attending: Vascular Surgery | Admitting: Vascular Surgery

## 2017-11-19 ENCOUNTER — Ambulatory Visit (INDEPENDENT_AMBULATORY_CARE_PROVIDER_SITE_OTHER): Payer: Medicare HMO | Admitting: Vascular Surgery

## 2017-11-19 ENCOUNTER — Encounter: Payer: Self-pay | Admitting: Vascular Surgery

## 2017-11-19 VITALS — BP 178/79 | HR 65 | Temp 98.0°F | Resp 18 | Ht 72.0 in | Wt 284.0 lb

## 2017-11-19 DIAGNOSIS — Z992 Dependence on renal dialysis: Secondary | ICD-10-CM | POA: Diagnosis not present

## 2017-11-19 DIAGNOSIS — Z01812 Encounter for preprocedural laboratory examination: Secondary | ICD-10-CM | POA: Diagnosis present

## 2017-11-19 DIAGNOSIS — N186 End stage renal disease: Secondary | ICD-10-CM

## 2017-11-19 DIAGNOSIS — I70208 Unspecified atherosclerosis of native arteries of extremities, other extremity: Secondary | ICD-10-CM | POA: Insufficient documentation

## 2017-11-19 NOTE — Progress Notes (Signed)
Vascular and Vein Specialist of [place]  Patient name: Nathan Oneal MRN: [id number] DOB: 11/30/1960 Sex: male  REASON FOR CONSULT: Discuss access for hemodialysis  HPI: Nathan Oneal is a 57 y.o. male, who is in today for discussion of access for hemodialysis.  He reports that he has been on dialysis for approximately 3 weeks via his right IJ hemodialysis catheter that was placed at [facility].  Reports that he feels better following dialysis initiation.  He has had no difficulty so far with his catheter.  He is right-handed.  Has had no prior access and has had no pacemaker.  Past Medical History:  Diagnosis Date  . Diabetes mellitus without complication (HCC)   . GERD (gastroesophageal reflux disease)   . Hypercholesteremia   . Hypertension   . Neuropathy   . Sleep apnea    cpap    History reviewed. No pertinent family history.  SOCIAL HISTORY: Social History   Socioeconomic History  . Marital status: Married    Spouse name: Not on file  . Number of children: Not on file  . Years of education: Not on file  . Highest education level: Not on file  Occupational History  . Not on file  Social Needs  . Financial resource strain: Not on file  . Food insecurity:    Worry: Not on file    Inability: Not on file  . Transportation needs:    Medical: Not on file    Non-medical: Not on file  Tobacco Use  . Smoking status: Never Smoker  Substance and Sexual Activity  . Alcohol use: No  . Drug use: No  . Sexual activity: Yes    Birth control/protection: None  Lifestyle  . Physical activity:    Days per week: Not on file    Minutes per session: Not on file  . Stress: Not on file  Relationships  . Social connections:    Talks on phone: Not on file    Gets together: Not on file    Attends religious service: Not on file    Active member of club or organization: Not on file    Attends meetings of clubs or organizations: Not  on file    Relationship status: Not on file  . Intimate partner violence:    Fear of current or ex partner: Not on file    Emotionally abused: Not on file    Physically abused: Not on file    Forced sexual activity: Not on file  Other Topics Concern  . Not on file  Social History Narrative  . Not on file    Allergies  Allergen Reactions  . Levaquin [Levofloxacin]     Current Outpatient Medications  Medication Sig Dispense Refill  . cetirizine (ZYRTEC) 10 MG chewable tablet Chew 10 mg by mouth daily.    Marland Kitchen. omeprazole (PRILOSEC) 40 MG capsule Take 40 mg by mouth daily.     No current facility-administered medications for this visit.     REVIEW OF SYSTEMS:  [X]  denotes positive finding, [ ]  denotes negative finding Cardiac  Comments:  Chest pain or chest pressure: x   Shortness of breath upon exertion: x   Short of breath when lying flat:    Irregular heart rhythm:        Vascular    Pain in calf, thigh, or hip brought on by ambulation:    Pain in feet at night that wakes you up from your sleep:  Blood clot in your veins:    Leg swelling:  x       Pulmonary    Oxygen at home:    Productive cough:     Wheezing:         Neurologic    Sudden weakness in arms or legs:     Sudden numbness in arms or legs:     Sudden onset of difficulty speaking or slurred speech:    Temporary loss of vision in one eye:     Problems with dizziness:         Gastrointestinal    Blood in stool:     Vomited blood:         Genitourinary    Burning when urinating:     Blood in urine:        Psychiatric    Major depression:         Hematologic    Bleeding problems:    Problems with blood clotting too easily:        Skin    Rashes or ulcers:        Constitutional    Fever or chills: x     PHYSICAL EXAM: Vitals:   11/19/17 1032 11/19/17 1035  BP: (!) 177/81 (!) 178/79  Pulse: 65   Resp: 18   Temp: 98 F (36.7 C)   TempSrc: Temporal   Weight: 284 lb (128.8 kg)     Height: 6' (1.829 m)     GENERAL: The patient is a well-nourished male, in no acute distress. The vital signs are documented above. CARDIOVASCULAR: 2+ radial pulses bilaterally PULMONARY: There is good air exchange  ABDOMEN: Soft and non-tender  MUSCULOSKELETAL: There are no major deformities or cyanosis. NEUROLOGIC: No focal weakness or paresthesias are detected. SKIN: Superficial ulceration over his lower extremities PSYCHIATRIC: The patient has a normal affect.  DATA:  Venous and arterial Dopplers were done at [facility] this morning.  I reviewed these.  He does appear to have adequate cephalic vein for fistula bilaterally.  MEDICAL ISSUES: Gust options with the patient to include use of AV catheter, AV fistula and AV graft.  I explained the recommended first choice of AV fistula with his vein size.  Explained the potential for non-maturation and near certainty of eventual further needs for long-term access.  He understands and we will schedule him on a nondialysis day.  He currently has dialysis on Tuesday Thursday and Saturday.  We will coordinate this is an outpatient at his earliest convenience   Larina Earthlyodd F. Luvina Poirier, MD [facility] Office Tel [phone] Pager [phone]

## 2017-11-21 ENCOUNTER — Other Ambulatory Visit: Payer: Self-pay | Admitting: Vascular Surgery

## 2017-11-28 ENCOUNTER — Other Ambulatory Visit: Payer: Self-pay | Admitting: *Deleted

## 2017-11-29 ENCOUNTER — Telehealth: Payer: Self-pay | Admitting: *Deleted

## 2017-11-29 NOTE — Telephone Encounter (Signed)
Left message on voice mail. Time change for 12/12/17 surgery. To arrive at [facility] at 9:30 am/surgery with Dr. Chestine Sporelark. Call if questions.

## 2017-12-10 ENCOUNTER — Encounter (HOSPITAL_COMMUNITY): Payer: Self-pay | Admitting: *Deleted

## 2017-12-10 ENCOUNTER — Other Ambulatory Visit: Payer: Self-pay

## 2017-12-10 NOTE — Progress Notes (Signed)
Pt denies SOB, chest pain, and being under the care of a cardiologist. Pt denies having a cardiac cath but stated that PCP, Dr. Sherril Croon of [facility] ordered some cardiac tests. Pt stated that a chest x ray was performed at [facility]; records requested from locations. Pt made aware to stop taking vitamins, fish oil and herbal medications. Do not take any NSAIDs ie: Ibuprofen, Advil, Naproxen (Aleve), Motrin, BC and Goody Powder. Pt made aware to check BG every 2 hours prior to arrival to hospital on DOS. Pt made aware to treat a BG < 70 with 4 glucose tabs or glucose gel or 4 ounces of apple or cranberry juice, wait 15 minutes after intervention to recheck BG, if BG remains < 70, call Short Stay unit to speak with a nurse. Pt made aware to take half dose of Tresiba sliding scale insulin on DOS if BG is > 70. Pt verbalized understanding of all pre-op instructions.

## 2017-12-11 MED ORDER — DEXTROSE 5 % IV SOLN
3.0000 g | INTRAVENOUS | Status: AC
  Administered 2017-12-12: 3 g via INTRAVENOUS
  Filled 2017-12-11: qty 3

## 2017-12-12 ENCOUNTER — Ambulatory Visit (HOSPITAL_COMMUNITY): Payer: Medicare HMO

## 2017-12-12 ENCOUNTER — Ambulatory Visit (HOSPITAL_COMMUNITY)
Admission: RE | Admit: 2017-12-12 | Discharge: 2017-12-12 | Disposition: A | Discharge status: Home / Self Care | Payer: Medicare HMO | Source: Ambulatory Visit | Attending: Vascular Surgery | Admitting: Vascular Surgery

## 2017-12-12 ENCOUNTER — Other Ambulatory Visit: Payer: Self-pay

## 2017-12-12 ENCOUNTER — Encounter (HOSPITAL_COMMUNITY)
Admission: RE | Disposition: A | Discharge status: Home / Self Care | Payer: Self-pay | Source: Ambulatory Visit | Attending: Vascular Surgery

## 2017-12-12 ENCOUNTER — Encounter (HOSPITAL_COMMUNITY): Payer: Self-pay | Admitting: *Deleted

## 2017-12-12 ENCOUNTER — Telehealth: Payer: Self-pay | Admitting: Vascular Surgery

## 2017-12-12 DIAGNOSIS — N186 End stage renal disease: Secondary | ICD-10-CM | POA: Diagnosis not present

## 2017-12-12 DIAGNOSIS — Z992 Dependence on renal dialysis: Secondary | ICD-10-CM | POA: Insufficient documentation

## 2017-12-12 DIAGNOSIS — Z6834 Body mass index (BMI) 34.0-34.9, adult: Secondary | ICD-10-CM | POA: Diagnosis not present

## 2017-12-12 DIAGNOSIS — Z9989 Dependence on other enabling machines and devices: Secondary | ICD-10-CM | POA: Diagnosis not present

## 2017-12-12 DIAGNOSIS — E7800 Pure hypercholesterolemia, unspecified: Secondary | ICD-10-CM | POA: Diagnosis not present

## 2017-12-12 DIAGNOSIS — Z881 Allergy status to other antibiotic agents status: Secondary | ICD-10-CM | POA: Diagnosis not present

## 2017-12-12 DIAGNOSIS — N185 Chronic kidney disease, stage 5: Secondary | ICD-10-CM | POA: Diagnosis not present

## 2017-12-12 DIAGNOSIS — E1140 Type 2 diabetes mellitus with diabetic neuropathy, unspecified: Secondary | ICD-10-CM | POA: Insufficient documentation

## 2017-12-12 DIAGNOSIS — I120 Hypertensive chronic kidney disease with stage 5 chronic kidney disease or end stage renal disease: Secondary | ICD-10-CM | POA: Diagnosis not present

## 2017-12-12 DIAGNOSIS — E669 Obesity, unspecified: Secondary | ICD-10-CM | POA: Insufficient documentation

## 2017-12-12 DIAGNOSIS — K219 Gastro-esophageal reflux disease without esophagitis: Secondary | ICD-10-CM | POA: Insufficient documentation

## 2017-12-12 DIAGNOSIS — I708 Atherosclerosis of other arteries: Secondary | ICD-10-CM | POA: Diagnosis not present

## 2017-12-12 DIAGNOSIS — G4730 Sleep apnea, unspecified: Secondary | ICD-10-CM | POA: Insufficient documentation

## 2017-12-12 DIAGNOSIS — E1122 Type 2 diabetes mellitus with diabetic chronic kidney disease: Secondary | ICD-10-CM | POA: Diagnosis present

## 2017-12-12 DIAGNOSIS — E785 Hyperlipidemia, unspecified: Secondary | ICD-10-CM | POA: Diagnosis not present

## 2017-12-12 DIAGNOSIS — I4510 Unspecified right bundle-branch block: Secondary | ICD-10-CM | POA: Insufficient documentation

## 2017-12-12 DIAGNOSIS — Z79899 Other long term (current) drug therapy: Secondary | ICD-10-CM | POA: Diagnosis not present

## 2017-12-12 HISTORY — DX: Chronic kidney disease, unspecified: N18.9

## 2017-12-12 HISTORY — DX: Anemia, unspecified: D64.9

## 2017-12-12 HISTORY — DX: Personal history of other diseases of the digestive system: Z87.19

## 2017-12-12 HISTORY — PX: AV FISTULA PLACEMENT: SHX1204

## 2017-12-12 LAB — GLUCOSE, CAPILLARY
Glucose-Capillary: 80 mg/dL (ref 70–99)
Glucose-Capillary: 74 mg/dL (ref 70–99)
Glucose-Capillary: 88 mg/dL (ref 70–99)

## 2017-12-12 LAB — POCT I-STAT 4, (NA,K, GLUC, HGB,HCT)
GLUCOSE: 85 mg/dL (ref 70–99)
HCT: 35 % — ABNORMAL LOW (ref 39.0–52.0)
Hemoglobin: 11.9 g/dL — ABNORMAL LOW (ref 13.0–17.0)
Potassium: 3.8 mmol/L (ref 3.5–5.1)
Sodium: 137 mmol/L (ref 135–145)

## 2017-12-12 LAB — HEMOGLOBIN A1C
Hgb A1c MFr Bld: 7.5 % — ABNORMAL HIGH (ref 4.8–5.6)
Mean Plasma Glucose: 168.55 mg/dL

## 2017-12-12 SURGERY — ARTERIOVENOUS (AV) FISTULA CREATION
Anesthesia: Monitor Anesthesia Care | Site: Arm Lower | Laterality: Left

## 2017-12-12 MED ORDER — SODIUM CHLORIDE 0.9 % IV SOLN
INTRAVENOUS | Status: DC | PRN
  Administered 2017-12-12: 13:00:00 via INTRAVENOUS

## 2017-12-12 MED ORDER — HYDRALAZINE HCL 20 MG/ML IJ SOLN
5.0000 mg | Freq: Once | INTRAMUSCULAR | Status: AC
  Administered 2017-12-12: 5 mg via INTRAVENOUS

## 2017-12-12 MED ORDER — SODIUM CHLORIDE 0.9 % IV SOLN
INTRAVENOUS | Status: AC
  Filled 2017-12-12: qty 1.2

## 2017-12-12 MED ORDER — LIDOCAINE-EPINEPHRINE (PF) 1 %-1:200000 IJ SOLN
INTRAMUSCULAR | Status: AC
  Filled 2017-12-12: qty 30

## 2017-12-12 MED ORDER — OXYCODONE HCL 5 MG PO TABS
ORAL_TABLET | ORAL | Status: AC
  Filled 2017-12-12: qty 1

## 2017-12-12 MED ORDER — PROMETHAZINE HCL 25 MG/ML IJ SOLN: 6.2500 mg | INTRAMUSCULAR | Status: DC | PRN

## 2017-12-12 MED ORDER — FENTANYL CITRATE (PF) 100 MCG/2ML IJ SOLN
INTRAMUSCULAR | Status: DC | PRN
  Administered 2017-12-12: 50 ug via INTRAVENOUS

## 2017-12-12 MED ORDER — SODIUM CHLORIDE 0.9 % IV SOLN
INTRAVENOUS | Status: DC
  Administered 2017-12-12: 11:00:00 via INTRAVENOUS

## 2017-12-12 MED ORDER — MEPERIDINE HCL 50 MG/ML IJ SOLN: 6.2500 mg | INTRAMUSCULAR | Status: DC | PRN

## 2017-12-12 MED ORDER — PHENYLEPHRINE HCL 10 MG/ML IJ SOLN
INTRAMUSCULAR | Status: DC | PRN
  Administered 2017-12-12 (×4): 80 ug via INTRAVENOUS

## 2017-12-12 MED ORDER — PROPOFOL 500 MG/50ML IV EMUL
INTRAVENOUS | Status: DC | PRN
  Administered 2017-12-12: 50 ug/kg/min via INTRAVENOUS

## 2017-12-12 MED ORDER — FENTANYL CITRATE (PF) 250 MCG/5ML IJ SOLN
INTRAMUSCULAR | Status: AC
  Filled 2017-12-12: qty 5

## 2017-12-12 MED ORDER — LIDOCAINE-EPINEPHRINE (PF) 1 %-1:200000 IJ SOLN
INTRAMUSCULAR | Status: DC | PRN
  Administered 2017-12-12: 5 mL via INTRADERMAL

## 2017-12-12 MED ORDER — HYDRALAZINE HCL 20 MG/ML IJ SOLN
5.0000 mg | Freq: Once | INTRAMUSCULAR | Status: AC
  Administered 2017-12-12: 5 mg via INTRAVENOUS
  Filled 2017-12-12: qty 0.25

## 2017-12-12 MED ORDER — MIDAZOLAM HCL 2 MG/2ML IJ SOLN
INTRAMUSCULAR | Status: AC
  Filled 2017-12-12: qty 2

## 2017-12-12 MED ORDER — HEPARIN SODIUM (PORCINE) 1000 UNIT/ML IJ SOLN
INTRAMUSCULAR | Status: DC | PRN
  Administered 2017-12-12: 3000 [IU] via INTRAVENOUS

## 2017-12-12 MED ORDER — FENTANYL CITRATE (PF) 100 MCG/2ML IJ SOLN: 25.0000 ug | INTRAMUSCULAR | Status: DC | PRN

## 2017-12-12 MED ORDER — HYDRALAZINE HCL 20 MG/ML IJ SOLN
INTRAMUSCULAR | Status: AC
  Administered 2017-12-12: 5 mg via INTRAVENOUS
  Filled 2017-12-12: qty 1

## 2017-12-12 MED ORDER — LIDOCAINE HCL (CARDIAC) PF 100 MG/5ML IV SOSY
PREFILLED_SYRINGE | INTRAVENOUS | Status: DC | PRN
  Administered 2017-12-12: 20 mg via INTRAVENOUS

## 2017-12-12 MED ORDER — HYDROCODONE-ACETAMINOPHEN 5-325 MG PO TABS: 1.0000 | ORAL_TABLET | Freq: Four times a day (QID) | ORAL | 0 refills | Status: AC | PRN

## 2017-12-12 MED ORDER — OXYCODONE HCL 5 MG/5ML PO SOLN: 5.0000 mg | Freq: Once | ORAL | Status: AC | PRN

## 2017-12-12 MED ORDER — SODIUM CHLORIDE 0.9 % IV SOLN
INTRAVENOUS | Status: DC | PRN
  Administered 2017-12-12: 30 ug/min via INTRAVENOUS

## 2017-12-12 MED ORDER — CHLORHEXIDINE GLUCONATE CLOTH 2 % EX PADS: 6.0000 | MEDICATED_PAD | Freq: Once | CUTANEOUS | Status: DC

## 2017-12-12 MED ORDER — EPHEDRINE SULFATE 50 MG/ML IJ SOLN
INTRAMUSCULAR | Status: DC | PRN
  Administered 2017-12-12 (×5): 5 mg via INTRAVENOUS

## 2017-12-12 MED ORDER — SODIUM CHLORIDE 0.9 % IV SOLN
INTRAVENOUS | Status: DC | PRN
  Administered 2017-12-12: 500 mL

## 2017-12-12 MED ORDER — EPHEDRINE 5 MG/ML INJ
INTRAVENOUS | Status: AC
  Filled 2017-12-12: qty 10

## 2017-12-12 MED ORDER — DEXTROSE 50 % IV SOLN
INTRAVENOUS | Status: AC
  Administered 2017-12-12: 12.5 mL via INTRAVENOUS
  Filled 2017-12-12: qty 50

## 2017-12-12 MED ORDER — OXYCODONE HCL 5 MG PO TABS
5.0000 mg | ORAL_TABLET | Freq: Once | ORAL | Status: AC | PRN
  Administered 2017-12-12: 5 mg via ORAL

## 2017-12-12 MED ORDER — MIDAZOLAM HCL 5 MG/5ML IJ SOLN
INTRAMUSCULAR | Status: DC | PRN
  Administered 2017-12-12: 1 mg via INTRAVENOUS

## 2017-12-12 MED ORDER — DEXTROSE 50 % IV SOLN
12.5000 mL | Freq: Once | INTRAVENOUS | Status: AC
  Administered 2017-12-12: 12.5 mL via INTRAVENOUS
  Filled 2017-12-12: qty 50

## 2017-12-12 SURGICAL SUPPLY — 39 items
ADH SKN CLS APL DERMABOND .7 (GAUZE/BANDAGES/DRESSINGS) ×1
AGENT HMST SPONGE THK3/8 (HEMOSTASIS)
ARMBAND PINK RESTRICT EXTREMIT (MISCELLANEOUS) ×4 IMPLANT
CANISTER SUCT 3000ML PPV (MISCELLANEOUS) ×3 IMPLANT
CLIP VESOCCLUDE MED 6/CT (CLIP) ×3 IMPLANT
CLIP VESOCCLUDE SM WIDE 24/CT (CLIP) ×2 IMPLANT
CLIP VESOCCLUDE SM WIDE 6/CT (CLIP) ×3 IMPLANT
COVER PROBE W GEL 5X96 (DRAPES) ×5 IMPLANT
DECANTER SPIKE VIAL GLASS SM (MISCELLANEOUS) ×3 IMPLANT
DERMABOND ADVANCED (GAUZE/BANDAGES/DRESSINGS) ×2
DERMABOND ADVANCED .7 DNX12 (GAUZE/BANDAGES/DRESSINGS) ×1 IMPLANT
ELECT REM PT RETURN 9FT ADLT (ELECTROSURGICAL) ×3
ELECTRODE REM PT RTRN 9FT ADLT (ELECTROSURGICAL) ×1 IMPLANT
GLOVE BIO SURGEON STRL SZ7.5 (GLOVE) ×3 IMPLANT
GLOVE BIOGEL M 6.5 STRL (GLOVE) ×2 IMPLANT
GLOVE BIOGEL PI IND STRL 8 (GLOVE) ×1 IMPLANT
GLOVE BIOGEL PI INDICATOR 8 (GLOVE) ×2
GLOVE SS BIOGEL STRL SZ 6.5 (GLOVE) IMPLANT
GLOVE SUPERSENSE BIOGEL SZ 6.5 (GLOVE) ×2
GOWN STRL REUS W/ TWL LRG LVL3 (GOWN DISPOSABLE) ×2 IMPLANT
GOWN STRL REUS W/ TWL XL LVL3 (GOWN DISPOSABLE) ×2 IMPLANT
GOWN STRL REUS W/TWL LRG LVL3 (GOWN DISPOSABLE) ×6
GOWN STRL REUS W/TWL XL LVL3 (GOWN DISPOSABLE) ×3
HEMOSTAT SPONGE AVITENE ULTRA (HEMOSTASIS) IMPLANT
KIT BASIN OR (CUSTOM PROCEDURE TRAY) ×3 IMPLANT
KIT TURNOVER KIT B (KITS) ×3 IMPLANT
NS IRRIG 1000ML POUR BTL (IV SOLUTION) ×3 IMPLANT
PACK CV ACCESS (CUSTOM PROCEDURE TRAY) ×3 IMPLANT
PAD ARMBOARD 7.5X6 YLW CONV (MISCELLANEOUS) ×6 IMPLANT
SUCTION FRAZIER HANDLE 10FR (MISCELLANEOUS) ×2
SUCTION TUBE FRAZIER 10FR DISP (MISCELLANEOUS) IMPLANT
SUT MNCRL AB 4-0 PS2 18 (SUTURE) ×3 IMPLANT
SUT PROLENE 6 0 BV (SUTURE) ×3 IMPLANT
SUT PROLENE 7 0 BV 1 (SUTURE) IMPLANT
SUT VIC AB 3-0 SH 27 (SUTURE) ×3
SUT VIC AB 3-0 SH 27X BRD (SUTURE) ×1 IMPLANT
TOWEL GREEN STERILE (TOWEL DISPOSABLE) ×3 IMPLANT
UNDERPAD 30X30 (UNDERPADS AND DIAPERS) ×3 IMPLANT
WATER STERILE IRR 1000ML POUR (IV SOLUTION) ×3 IMPLANT

## 2017-12-12 NOTE — Telephone Encounter (Signed)
sch appt lvm mld ltr 01/15/18 3pm Dialysis duplex 345pm p/o MD

## 2017-12-12 NOTE — Anesthesia Preprocedure Evaluation (Addendum)
Anesthesia Evaluation  Patient identified by MRN, date of birth, ID band Patient awake    Reviewed: Allergy & Precautions, NPO status , Patient's Chart, lab work & pertinent test results, reviewed documented beta blocker date and time   Airway Mallampati: II  TM Distance: >3 FB Neck ROM: Full    Dental  (+) Dental Advisory Given   Pulmonary sleep apnea and Continuous Positive Airway Pressure Ventilation ,    Pulmonary exam normal breath sounds clear to auscultation       Cardiovascular hypertension, Pt. on home beta blockers and Pt. on medications Normal cardiovascular exam Rhythm:Regular Rate:Normal  ECG: Rate 62. Normal sinus rhythm Incomplete right bundle branch block   Neuro/Psych negative neurological ROS  negative psych ROS   GI/Hepatic Neg liver ROS, hiatal hernia, GERD  Medicated,  Endo/Other  diabetes  Renal/GU ESRF and Renal InsufficiencyRenal disease     Musculoskeletal negative musculoskeletal ROS (+)   Abdominal (+) + obese,   Peds  Hematology  (+) anemia , HLD   Anesthesia Other Findings CHRONIC KIDNEY DISEASE  Reproductive/Obstetrics                            Anesthesia Physical Anesthesia Plan  ASA: III  Anesthesia Plan: MAC   Post-op Pain Management:    Induction: Intravenous  PONV Risk Score and Plan: 1 and Propofol infusion and Treatment may vary due to age or medical condition  Airway Management Planned: Natural Airway  Additional Equipment:   Intra-op Plan:   Post-operative Plan:   Informed Consent: I have reviewed the patients History and Physical, chart, labs and discussed the procedure including the risks, benefits and alternatives for the proposed anesthesia with the patient or authorized representative who has indicated his/her understanding and acceptance.   Dental advisory given  Plan Discussed with: CRNA  Anesthesia Plan Comments:          Anesthesia Quick Evaluation

## 2017-12-12 NOTE — Op Note (Signed)
OPERATIVE NOTE   PROCEDURE: Left brachiocephalic arteriovenous fistula placement  PRE-OPERATIVE DIAGNOSIS: ESRD  POST-OPERATIVE DIAGNOSIS: same as above   SURGEON: Marty Heck, MD  ANESTHESIA: MAC  ESTIMATED BLOOD LOSS: 25 mL  FINDING(S): 1.  Cephalic vein: 3 mm, acceptable 2.  Brachial artery: 3 mm, atherosclerotic disease evident 3.  Venous outflow: palpable thrill  4.  Radial flow: palpable radial pulse  SPECIMEN(S):  none  INDICATIONS:   Nathan Oneal is a 57 y.o. male who presents with ESRD for permanent dialysis access.  He is currently dialyzing via a tunneled catheter.  The patient is scheduled for left brachiocephalic arteriovenous fistula placement.  The patient is aware the risks include but are not limited to: bleeding, infection, steal syndrome, nerve damage, ischemic monomelic neuropathy, failure to mature, and need for additional procedures.  The patient is aware of the risks of the procedure and elects to proceed forward.  DESCRIPTION: After full informed written consent was obtained from the patient, the patient was brought back to the operating room and placed supine upon the operating table.  Prior to induction, the patient received IV antibiotics.   After obtaining adequate anesthesia, the patient was then prepped and draped in the standard fashion for a left arm access procedure.  I turned my attention first to identifying the patient's cephalic vein and brachial artery.  Using SonoSite guidance, the location of these vessels were marked out on the skin.     At this point, I injected local anesthetic to obtain a field block of the antecubitum.  In total, I injected about 10 mL of 1% lidocaine with epinephrine.  I made a transverse incision just above the level of the antecubitum and dissected through the subcutaneous tissue and fascia to gain exposure of the brachial artery.  This was noted to be 3 mm in diameter externally.  This was dissected out  proximally and distally and controlled with vessel loops .  I then dissected out the cephalic vein.  This was noted to be 3 mm in diameter externally.  The distal segment of the vein was ligated with a  2-0 silk and vessel clip, and the vein was transected.  The proximal segment was interrogated with serial dilators.  The vein accepted up to a 3.5 mm dilator without any difficulty.  I then instilled the heparinized saline into the vein and clamped it.  At this point, I reset my exposure of the brachial artery and placed the artery under tension proximally and distally.  Unfortunately the artery was moderately diseased and I could not get control proximal with a vessel loop and had to use a profunda clamp.  I made an arteriotomy with a #11 blade, and then I extended the arteriotomy with a Potts scissor.  I injected heparinized saline proximal and distal to this arteriotomy.  The vein was then sewn to the artery in an end-to-side configuration with a running stitch of 6-0 Prolene.  Prior to completing this anastomosis, I allowed the vein and artery to backbleed.  There was no evidence of clot from any vessels.  I completed the anastomosis in the usual fashion and then released all vessel loops and clamps.    There was a palpable thrill in the venous outflow, and there was a palpable radial pulse.  We also confirmed there was an ulnar signal and palmar arch signal without fistula compression.  At this point, I irrigated out the surgical wound.  There was no further active  bleeding.  The subcutaneous tissue was reapproximated with a running stitch of 3-0 Vicryl.  The skin was then reapproximated with a running subcuticular stitch of 4-0 Monocryl.  The skin was then cleaned, dried, and reinforced with Dermabond.  The patient tolerated this procedure well.   COMPLICATIONS: None  CONDITION: Stable   Marty Heck, MD [facility] and Vein Specialists of [place] Office: [phone] Pager:  [phone]  12/12/2017, 2:42 PM

## 2017-12-12 NOTE — H&P (Signed)
History and Physical Interval Note:  12/12/2017 10:40 AM  Nathan Oneal  has presented today for surgery, with the diagnosis of CHRONIC KIDNEY DISEASE  The various methods of treatment have been discussed with the patient and family. After consideration of risks, benefits and other options for treatment, the patient has consented to  Procedure(s): ARTERIOVENOUS (AV) FISTULA CREATION ARM (Left) as a surgical intervention .  The patient's history has been reviewed, patient examined, no change in status, stable for surgery.  I have reviewed the patient's chart and labs.  Questions were answered to the patient's satisfaction.    Left upper extremity fistula.  Cephus Shelling  Vascular and Vein Specialist of [facility]  Patient name: Nathan Oneal          MRN: [id number]        DOB: 02-Nov-1960            Sex: male  REASON FOR CONSULT: Discuss access for hemodialysis  HPI: Nathan Oneal is a 57 y.o. male, who is in today for discussion of access for hemodialysis.  He reports that he has been on dialysis for approximately 3 weeks via his right IJ hemodialysis catheter that was placed at [facility].  Reports that he feels better following dialysis initiation.  He has had no difficulty so far with his catheter.  He is right-handed.  Has had no prior access and has had no pacemaker.      Past Medical History:  Diagnosis Date  . Diabetes mellitus without complication (HCC)   . GERD (gastroesophageal reflux disease)   . Hypercholesteremia   . Hypertension   . Neuropathy   . Sleep apnea    cpap    History reviewed. No pertinent family history.  SOCIAL HISTORY: Social History        Socioeconomic History  . Marital status: Married    Spouse name: Not on file  . Number of children: Not on file  . Years of education: Not on file  . Highest education level: Not on file  Occupational History  . Not on file  Social Needs  . Financial resource strain: Not on  file  . Food insecurity:    Worry: Not on file    Inability: Not on file  . Transportation needs:    Medical: Not on file    Non-medical: Not on file  Tobacco Use  . Smoking status: Never Smoker  Substance and Sexual Activity  . Alcohol use: No  . Drug use: No  . Sexual activity: Yes    Birth control/protection: None  Lifestyle  . Physical activity:    Days per week: Not on file    Minutes per session: Not on file  . Stress: Not on file  Relationships  . Social connections:    Talks on phone: Not on file    Gets together: Not on file    Attends religious service: Not on file    Active member of club or organization: Not on file    Attends meetings of clubs or organizations: Not on file    Relationship status: Not on file  . Intimate partner violence:    Fear of current or ex partner: Not on file    Emotionally abused: Not on file    Physically abused: Not on file    Forced sexual activity: Not on file  Other Topics Concern  . Not on file  Social History Narrative  . Not on file  Allergies  Allergen Reactions  . Levaquin [Levofloxacin]           Current Outpatient Medications  Medication Sig Dispense Refill  . cetirizine (ZYRTEC) 10 MG chewable tablet Chew 10 mg by mouth daily.    Marland Kitchen omeprazole (PRILOSEC) 40 MG capsule Take 40 mg by mouth daily.     No current facility-administered medications for this visit.     REVIEW OF SYSTEMS:  [X]  denotes positive finding, [ ]  denotes negative finding Cardiac  Comments:  Chest pain or chest pressure: x   Shortness of breath upon exertion: x   Short of breath when lying flat:    Irregular heart rhythm:        Vascular    Pain in calf, thigh, or hip brought on by ambulation:    Pain in feet at night that wakes you up from your sleep:     Blood clot in your veins:    Leg swelling:  x       Pulmonary    Oxygen at home:    Productive  cough:     Wheezing:         Neurologic    Sudden weakness in arms or legs:     Sudden numbness in arms or legs:     Sudden onset of difficulty speaking or slurred speech:    Temporary loss of vision in one eye:     Problems with dizziness:         Gastrointestinal    Blood in stool:     Vomited blood:         Genitourinary    Burning when urinating:     Blood in urine:        Psychiatric    Major depression:         Hematologic    Bleeding problems:    Problems with blood clotting too easily:        Skin    Rashes or ulcers:        Constitutional    Fever or chills: x     PHYSICAL EXAM:     Vitals:   11/19/17 1032 11/19/17 1035  BP: (!) 177/81 (!) 178/79  Pulse: 65   Resp: 18   Temp: 98 F (36.7 C)   TempSrc: Temporal   Weight: 284 lb (128.8 kg)   Height: 6' (1.829 m)     GENERAL: The patient is a well-nourished male, in no acute distress. The vital signs are documented above. CARDIOVASCULAR: 2+ radial pulses bilaterally PULMONARY: There is good air exchange  ABDOMEN: Soft and non-tender  MUSCULOSKELETAL: There are no major deformities or cyanosis. NEUROLOGIC: No focal weakness or paresthesias are detected. SKIN: Superficial ulceration over his lower extremities PSYCHIATRIC: The patient has a normal affect.  DATA:  Venous and arterial Dopplers were done at [facility] this morning.  I reviewed these.  He does appear to have adequate cephalic vein for fistula bilaterally.  MEDICAL ISSUES: Gust options with the patient to include use of AV catheter, AV fistula and AV graft.  I explained the recommended first choice of AV fistula with his vein size.  Explained the potential for non-maturation and near certainty of eventual further needs for long-term access.  He understands and we will schedule him on a nondialysis day.  He currently has dialysis on Tuesday Thursday and  Saturday.  We will coordinate this is an outpatient at his earliest convenience   Larina Earthly, MD FACS  [facility] Specialists of [facility] Tel [phone] Pager [phone]           Electronically signed by Larina Earthly, MD at 11/19/2017 11:09 AM

## 2017-12-12 NOTE — Transfer of Care (Signed)
Immediate Anesthesia Transfer of Care Note  Patient: Nathan Oneal  Procedure(s) Performed: ARTERIOVENOUS (AV) FISTULA CREATION ARM (Left Arm Lower)  Patient Location: PACU  Anesthesia Type:MAC  Level of Consciousness: awake and alert   Airway & Oxygen Therapy: Patient Spontanous Breathing and Patient connected to face mask oxygen  Post-op Assessment: Report given to RN and Post -op Vital signs reviewed and stable  Post vital signs: Reviewed and stable  Last Vitals:  Vitals Value Taken Time  BP 154/67 12/12/2017  2:59 PM  Temp    Pulse 78 12/12/2017  3:00 PM  Resp 20 12/12/2017  3:00 PM  SpO2 100 % 12/12/2017  3:00 PM  Vitals shown include unvalidated device data.  Last Pain:  Vitals:   12/12/17 1032  TempSrc:   PainSc: 0-No pain         Complications: No apparent anesthesia complications

## 2017-12-12 NOTE — Discharge Instructions (Signed)
Vascular and Vein Specialists of [facility]  Discharge Instructions  AV Fistula or Graft Surgery for Dialysis Access  Please refer to the following instructions for your post-procedure care. Your surgeon or physician assistant will discuss any changes with you.  Activity  You may drive the day following your surgery, if you are comfortable and no longer taking prescription pain medication. Resume full activity as the soreness in your incision resolves.  Bathing/Showering  You may shower after you go home. Keep your incision dry for 48 hours. Do not soak in a bathtub, hot tub, or swim until the incision heals completely. You may not shower if you have a hemodialysis catheter.  Incision Care  Clean your incision with mild soap and water after 48 hours. Pat the area dry with a clean towel. You do not need a bandage unless otherwise instructed. Do not apply any ointments or creams to your incision. You may have skin glue on your incision. Do not peel it off. It will come off on its own in about one week. Your arm may swell a bit after surgery. To reduce swelling use pillows to elevate your arm so it is above your heart. Your doctor will tell you if you need to lightly wrap your arm with an ACE bandage.  Diet  Resume your normal diet. There are not special food restrictions following this procedure. In order to heal from your surgery, it is CRITICAL to get adequate nutrition. Your body requires vitamins, minerals, and protein. Vegetables are the best source of vitamins and minerals. Vegetables also provide the perfect balance of protein. Processed food has little nutritional value, so try to avoid this.  Medications  Resume taking all of your medications. If your incision is causing pain, you may take over-the counter pain relievers such as acetaminophen (Tylenol). If you were prescribed a stronger pain medication, please be aware these medications can cause nausea and constipation. Prevent  nausea by taking the medication with a snack or meal. Avoid constipation by drinking plenty of fluids and eating foods with high amount of fiber, such as fruits, vegetables, and grains.  Do not take Tylenol if you are taking prescription pain medications.  Follow up Your surgeon may want to see you in the office following your access surgery. If so, this will be arranged at the time of your surgery.  Please call us immediately for any of the following conditions:  Increased pain, redness, drainage (pus) from your incision site Fever of 101 degrees or higher Severe or worsening pain at your incision site Hand pain or numbness.  Reduce your risk of vascular disease:  Stop smoking. If you would like help, call [facility] at 1-800-QUIT-NOW (616 571 5198) or [place] at 367-455-3642  Manage your cholesterol Maintain a desired weight Control your diabetes Keep your blood pressure down  Dialysis  It will take several weeks to several months for your new dialysis access to be ready for use. Your surgeon will determine when it is okay to use it. Your nephrologist will continue to direct your dialysis. You can continue to use your Permcath until your new access is ready for use.   12/12/2017 Nathan Oneal [id number] 1960-04-04  Surgeon(s): Cephus Shelling, MD  Procedure(s): ARTERIOVENOUS (AV) FISTULA CREATION ARM         Do not stick fistula until follow-up    If you have any questions, please call the office at [phone].   Post Anesthesia Home Care Instructions  Activity: Get plenty of  rest for the remainder of the day. A responsible individual must stay with you for 24 hours following the procedure.  For the next 24 hours, DO NOT: -Drive a car -Advertising copywriter -Drink alcoholic beverages -Take any medication unless instructed by your physician -Make any legal decisions or sign important papers.  Meals: Start with liquid foods such as gelatin or soup.  Progress to regular foods as tolerated. Avoid greasy, spicy, heavy foods. If nausea and/or vomiting occur, drink only clear liquids until the nausea and/or vomiting subsides. Call your physician if vomiting continues.  Special Instructions/Symptoms: Your throat may feel dry or sore from the anesthesia or the breathing tube placed in your throat during surgery. If this causes discomfort, gargle with warm salt water. The discomfort should disappear within 24 hours.  If you had a scopolamine patch placed behind your ear for the management of post- operative nausea and/or vomiting:  1. The medication in the patch is effective for 72 hours, after which it should be removed.  Wrap patch in a tissue and discard in the trash. Wash hands thoroughly with soap and water. 2. You may remove the patch earlier than 72 hours if you experience unpleasant side effects which may include dry mouth, dizziness or visual disturbances. 3. Avoid touching the patch. Wash your hands with soap and water after contact with the patch.

## 2017-12-13 ENCOUNTER — Encounter (HOSPITAL_COMMUNITY): Payer: Self-pay | Admitting: Vascular Surgery

## 2017-12-14 ENCOUNTER — Other Ambulatory Visit: Payer: Self-pay

## 2017-12-14 DIAGNOSIS — Z992 Dependence on renal dialysis: Principal | ICD-10-CM

## 2017-12-14 DIAGNOSIS — N186 End stage renal disease: Secondary | ICD-10-CM

## 2017-12-14 NOTE — Anesthesia Postprocedure Evaluation (Signed)
Anesthesia Post Note  Patient: Nathan Oneal  Procedure(s) Performed: ARTERIOVENOUS (AV) FISTULA CREATION ARM (Left Arm Lower)     Patient location during evaluation: PACU Anesthesia Type: MAC Level of consciousness: awake and alert Pain management: pain level controlled Vital Signs Assessment: post-procedure vital signs reviewed and stable Respiratory status: spontaneous breathing Cardiovascular status: stable Anesthetic complications: no    Last Vitals:  Vitals:   12/12/17 1530 12/12/17 1545  BP: (!) 159/75 (!) 160/71  Pulse: 73 73  Resp: 16 18  Temp:    SpO2: 98% 98%    Last Pain:  Vitals:   12/12/17 1600  TempSrc:   PainSc: 2    Pain Goal:                 Nolon Nations

## 2018-01-15 ENCOUNTER — Encounter: Payer: Self-pay | Admitting: Vascular Surgery

## 2018-01-15 ENCOUNTER — Ambulatory Visit (HOSPITAL_COMMUNITY)
Admission: RE | Admit: 2018-01-15 | Discharge: 2018-01-15 | Disposition: A | Discharge status: Home / Self Care | Payer: Medicare HMO | Source: Ambulatory Visit | Attending: Vascular Surgery | Admitting: Vascular Surgery

## 2018-01-15 ENCOUNTER — Other Ambulatory Visit: Payer: Self-pay

## 2018-01-15 ENCOUNTER — Ambulatory Visit (INDEPENDENT_AMBULATORY_CARE_PROVIDER_SITE_OTHER): Payer: Medicare HMO | Admitting: Vascular Surgery

## 2018-01-15 DIAGNOSIS — Z992 Dependence on renal dialysis: Secondary | ICD-10-CM

## 2018-01-15 DIAGNOSIS — N186 End stage renal disease: Secondary | ICD-10-CM

## 2018-01-15 NOTE — Progress Notes (Signed)
Vitals:   01/15/18 1529  BP: (!) 186/72  Pulse: 62  Resp: 16  Temp: 98.4 F (36.9 C)  TempSrc: Oral  SpO2: 96%  Weight: 255 lb (115.7 kg)  Height: 6' (1.829 m)

## 2018-01-15 NOTE — Progress Notes (Signed)
Patient name: Nathan Oneal MRN: [id number] DOB: May 30, 1960 Sex: male  REASON FOR VISIT: 1 month follow-up after left brachiocephalic fistula  HPI: Nathan Oneal is a 57 y.o. male with end-stage renal disease who presents for one-month follow-up after left brachiocephalic fistula.  He reports no issues in the interim.  His incision has healed without any complaints.  He has had no weakness, numbness, tingling in his left hand.  Says he can feel a good thrill.  No specific complaints today.  Using a right IJ tunneled catheter for dialysis at this time.  Past Medical History:  Diagnosis Date  . Anemia   . Chronic kidney disease   . Diabetes mellitus without complication (HCC)   . GERD (gastroesophageal reflux disease)   . History of hiatal hernia   . Hypercholesteremia   . Hypertension   . Neuropathy   . Sleep apnea    cpap    Past Surgical History:  Procedure Laterality Date  . AMPUTATION Left 08/20/2012   Procedure: AMPUTATION THIRD AND FOURTH TOE LEFT FOOT;  Surgeon: Dallas Schimke, DPM;  Location: [facility];  Service: Orthopedics;  Laterality: Left;  . AV FISTULA PLACEMENT Left 12/12/2017   Procedure: ARTERIOVENOUS (AV) FISTULA CREATION ARM;  Surgeon: Cephus Shelling, MD;  Location: [facility];  Service: Vascular;  Laterality: Left;  . CHOLECYSTECTOMY    . EYE SURGERY     laser of eyes for retinopathy  . TOE AMPUTATION     left foot-5th and 2nd toe and tip of big toe. right tip of 4th toe removed  . WISDOM TOOTH EXTRACTION      History reviewed. No pertinent family history.  SOCIAL HISTORY: Social History   Tobacco Use  . Smoking status: Never Smoker  . Smokeless tobacco: Never Used  Substance Use Topics  . Alcohol use: No    Allergies  Allergen Reactions  . Levaquin [Levofloxacin] Other (See Comments)    Blood pressure dropped, dehydrated     Current Outpatient Medications  Medication Sig Dispense Refill  . cetirizine (ZYRTEC) 10 MG tablet Take 10 mg  by mouth daily as needed for allergies.     . hydrALAZINE (APRESOLINE) 50 MG tablet Take 50 mg by mouth 3 (three) times daily.    . insulin degludec (TRESIBA FLEXTOUCH) 100 UNIT/ML SOPN FlexTouch Pen Inject 10-25 Units into the skin every morning.     . metoprolol tartrate (LOPRESSOR) 50 MG tablet Take 50 mg by mouth 2 (two) times daily.    Marland Kitchen omeprazole (PRILOSEC) 20 MG capsule Take 20 mg by mouth daily.     Marland Kitchen HYDROcodone-acetaminophen (NORCO/VICODIN) 5-325 MG tablet Take 1 tablet by mouth every 6 (six) hours as needed for moderate pain. (Patient not taking: Reported on 01/15/2018) 20 tablet 0   No current facility-administered medications for this visit.     REVIEW OF SYSTEMS:  [X]  denotes positive finding, [ ]  denotes negative finding Cardiac  Comments:  Chest pain or chest pressure:    Shortness of breath upon exertion:    Short of breath when lying flat:    Irregular heart rhythm:        Vascular    Pain in calf, thigh, or hip brought on by ambulation:    Pain in feet at night that wakes you up from your sleep:     Blood clot in your veins:    Leg swelling:         Pulmonary    Oxygen at home:  Productive cough:     Wheezing:         Neurologic    Sudden weakness in arms or legs:     Sudden numbness in arms or legs:     Sudden onset of difficulty speaking or slurred speech:    Temporary loss of vision in one eye:     Problems with dizziness:         Gastrointestinal    Blood in stool:     Vomited blood:         Genitourinary    Burning when urinating:     Blood in urine:        Psychiatric    Major depression:         Hematologic    Bleeding problems:    Problems with blood clotting too easily:        Skin    Rashes or ulcers:        Constitutional    Fever or chills:      PHYSICAL EXAM: Vitals:   01/15/18 1529 01/15/18 1532  BP: (!) 186/72 (!) 188/77  Pulse: 62 62  Resp: 16   Temp: 98.4 F (36.9 C)   TempSrc: Oral   SpO2: 96%   Weight: 115.7  kg   Height: 6' (1.829 m)     GENERAL: The patient is a well-nourished male, in no acute distress. The vital signs are documented above. CARDIAC: There is a regular rate and rhythm.  VASCULAR:  Great thrill in left brachiocephalic AVF Weakly palpable left radial pulse No tissue loss in left hand PULMONARY: There is good air exchange bilaterally without wheezing or rales.  DATA:   I have independently reviewed his vein mapping which shows nice flow volume of 2241within the fistula with a cephalic veins that has dilated nicely to greater than 6 mm.  Assessment/Plan:  57 year old male that presents for one-month follow-up after left brachycephalic fistula.  Overall seems to be doing well and has a nice thrill in the fistula.  His fistula duplex today looks great and the vein has dilated to >6 mm.  Discussed that we typically wait 3 months after surgery before allowing access to the vein so it can fully mature.  He will follow-up as needed.   Cephus Shelling, MD [facility] Office: [phone] Pager: [phone]
